# Patient Record
Sex: Male | Born: 1955 | Race: White | Hispanic: No | Marital: Married | Smoking: Never smoker
Health system: Southern US, Community
[De-identification: ages and names within clinical notes are randomized; demographics above are authoritative.]

## PROBLEM LIST (undated history)

## (undated) DIAGNOSIS — K4090 Unilateral inguinal hernia, without obstruction or gangrene, not specified as recurrent: Secondary | ICD-10-CM

## (undated) DIAGNOSIS — E7800 Pure hypercholesterolemia, unspecified: Secondary | ICD-10-CM

## (undated) DIAGNOSIS — K2270 Barrett's esophagus without dysplasia: Secondary | ICD-10-CM

## (undated) DIAGNOSIS — I493 Ventricular premature depolarization: Secondary | ICD-10-CM

## (undated) DIAGNOSIS — Z9289 Personal history of other medical treatment: Secondary | ICD-10-CM

## (undated) DIAGNOSIS — K219 Gastro-esophageal reflux disease without esophagitis: Secondary | ICD-10-CM

## (undated) DIAGNOSIS — N289 Disorder of kidney and ureter, unspecified: Secondary | ICD-10-CM

## (undated) DIAGNOSIS — M67919 Unspecified disorder of synovium and tendon, unspecified shoulder: Secondary | ICD-10-CM

## (undated) DIAGNOSIS — J3089 Other allergic rhinitis: Secondary | ICD-10-CM

## (undated) DIAGNOSIS — N183 Chronic kidney disease, stage 3 (moderate): Secondary | ICD-10-CM

## (undated) DIAGNOSIS — T7840XA Allergy, unspecified, initial encounter: Secondary | ICD-10-CM

## (undated) HISTORY — DX: Allergy, unspecified, initial encounter: T78.40XA

## (undated) HISTORY — DX: Other allergic rhinitis: J30.89

## (undated) HISTORY — PX: WISDOM TOOTH EXTRACTION: SHX21

## (undated) HISTORY — DX: Barrett's esophagus without dysplasia: K22.70

## (undated) HISTORY — DX: Unspecified disorder of synovium and tendon, unspecified shoulder: M67.919

## (undated) HISTORY — DX: Disorder of kidney and ureter, unspecified: N28.9

## (undated) HISTORY — DX: Ventricular premature depolarization: I49.3

## (undated) HISTORY — PX: EYE SURGERY: SHX253

## (undated) HISTORY — DX: Pure hypercholesterolemia, unspecified: E78.00

## (undated) HISTORY — DX: Chronic kidney disease, stage 3 (moderate): N18.3

## (undated) HISTORY — DX: Personal history of other medical treatment: Z92.89

## (undated) HISTORY — DX: Unilateral inguinal hernia, without obstruction or gangrene, not specified as recurrent: K40.90

## (undated) HISTORY — PX: CIRCUMCISION: SUR203

## (undated) HISTORY — PX: OTHER SURGICAL HISTORY: SHX169

## (undated) HISTORY — DX: Gastro-esophageal reflux disease without esophagitis: K21.9

---

## 2007-01-26 ENCOUNTER — Ambulatory Visit: Payer: Self-pay | Admitting: Gastroenterology

## 2007-05-20 ENCOUNTER — Ambulatory Visit: Payer: Self-pay | Admitting: Internal Medicine

## 2008-06-02 ENCOUNTER — Ambulatory Visit: Payer: Self-pay | Admitting: Family Medicine

## 2011-06-13 ENCOUNTER — Ambulatory Visit: Payer: Self-pay | Admitting: Gastroenterology

## 2011-06-13 HISTORY — PX: COLONOSCOPY: SHX5424

## 2011-06-13 LAB — COLOGUARD: Cologuard: NEGATIVE

## 2011-06-13 LAB — HM COLONOSCOPY

## 2011-06-17 LAB — PATHOLOGY REPORT

## 2011-10-24 ENCOUNTER — Ambulatory Visit: Payer: Self-pay | Admitting: Gastroenterology

## 2012-06-11 ENCOUNTER — Ambulatory Visit: Payer: Self-pay | Admitting: Gastroenterology

## 2012-06-14 LAB — PATHOLOGY REPORT

## 2013-01-25 ENCOUNTER — Ambulatory Visit: Payer: Self-pay | Admitting: Family Medicine

## 2014-07-04 DIAGNOSIS — N183 Chronic kidney disease, stage 3 unspecified: Secondary | ICD-10-CM

## 2014-07-04 HISTORY — DX: Chronic kidney disease, stage 3 unspecified: N18.30

## 2014-07-18 DIAGNOSIS — K2270 Barrett's esophagus without dysplasia: Secondary | ICD-10-CM | POA: Insufficient documentation

## 2014-07-20 DIAGNOSIS — E7800 Pure hypercholesterolemia, unspecified: Secondary | ICD-10-CM | POA: Insufficient documentation

## 2014-07-20 DIAGNOSIS — J309 Allergic rhinitis, unspecified: Secondary | ICD-10-CM | POA: Insufficient documentation

## 2014-07-21 DIAGNOSIS — N183 Chronic kidney disease, stage 3 unspecified: Secondary | ICD-10-CM | POA: Insufficient documentation

## 2014-10-13 IMAGING — US US EXTREM LOW VENOUS*R*
1 series · 14 of 24 positions shown · non-contrast
Comparison: none

REASON FOR EXAM: Call report [id number]  eval for DVT increased pain
heat redness  swelling
COMMENTS:

[Series 1: us extrem low venous*right* · 0.09mm/px · 14 of 43 slices shown]
[im 1/43]
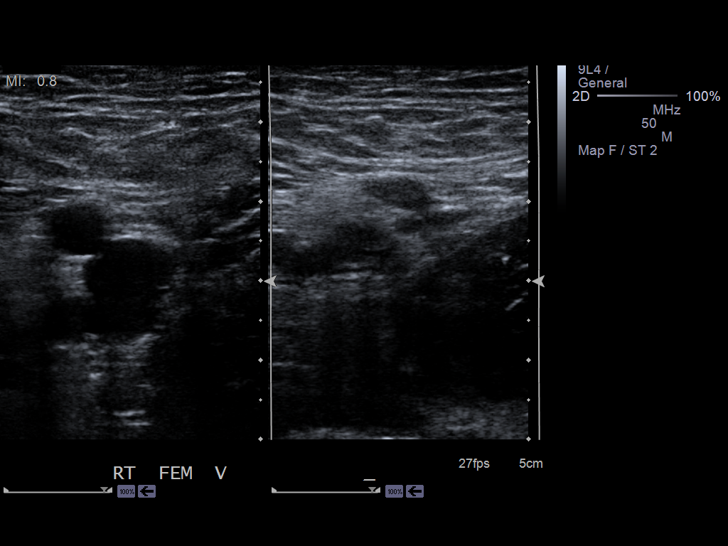
[im 4/43]
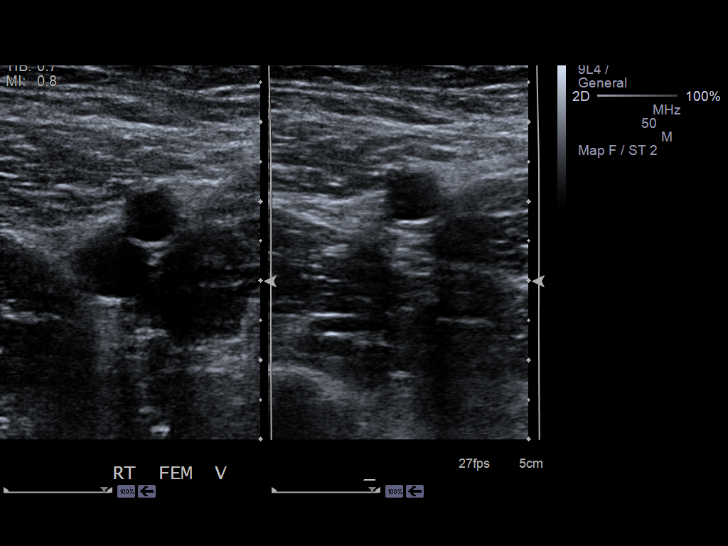
[im 8/43]
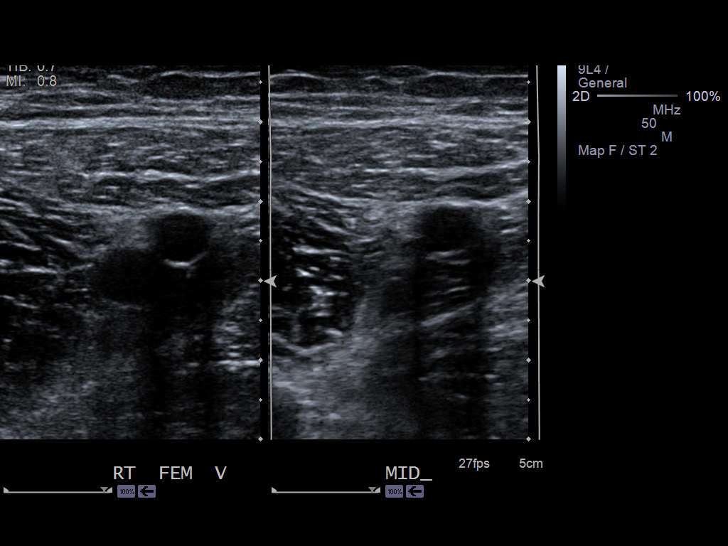
[im 11/43]
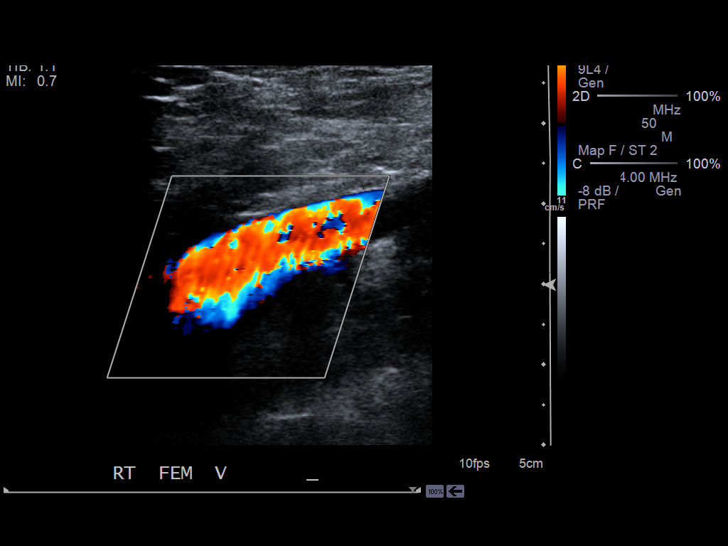
[im 13/43]
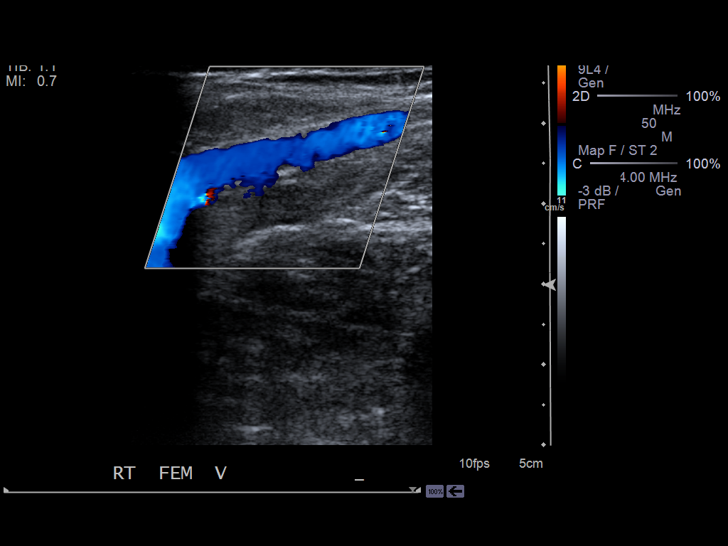
[im 17/43]
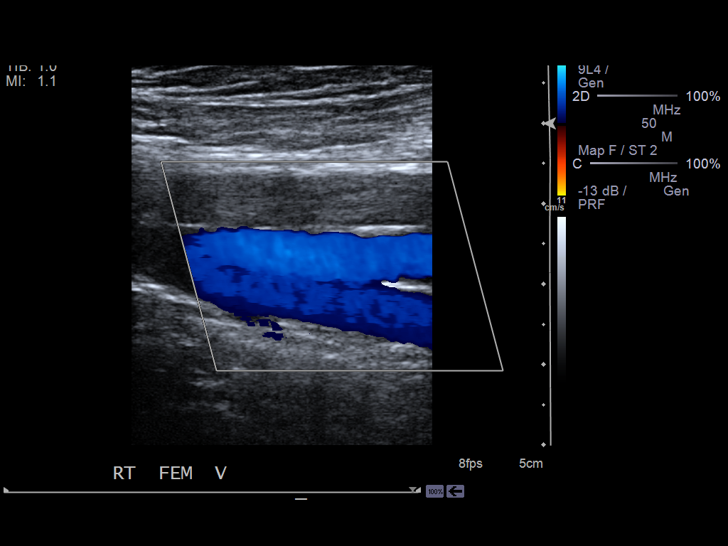
[im 21/43]
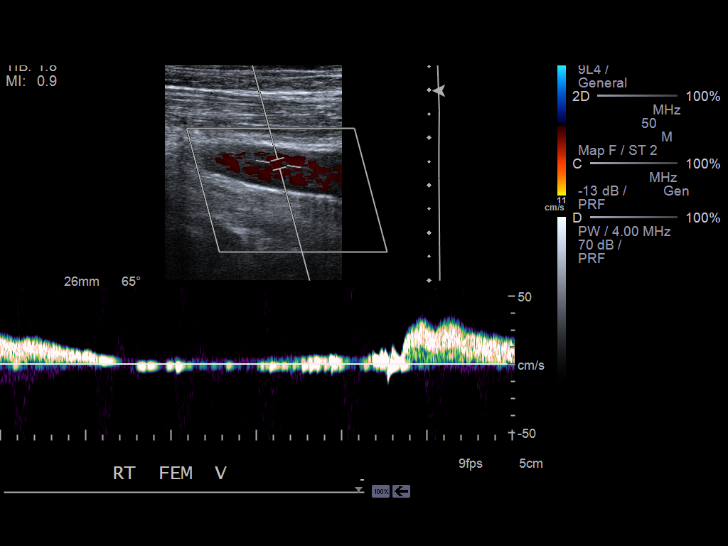
[im 22/43]
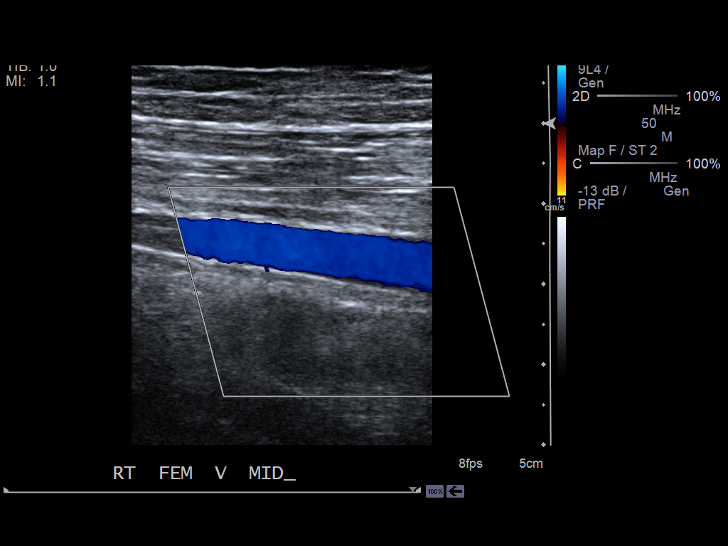
[im 26/43]
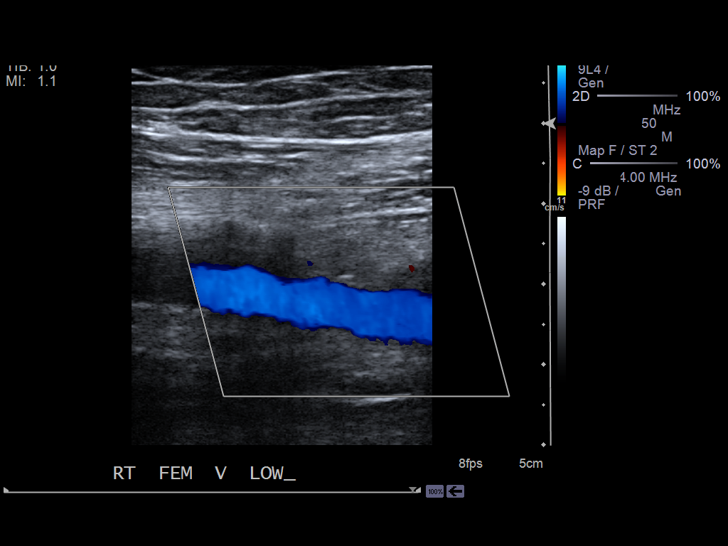
[im 30/43]
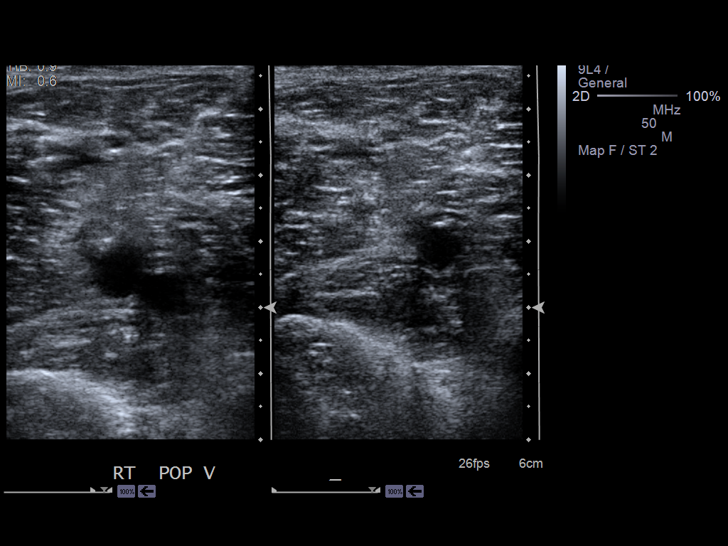
[im 33/43]
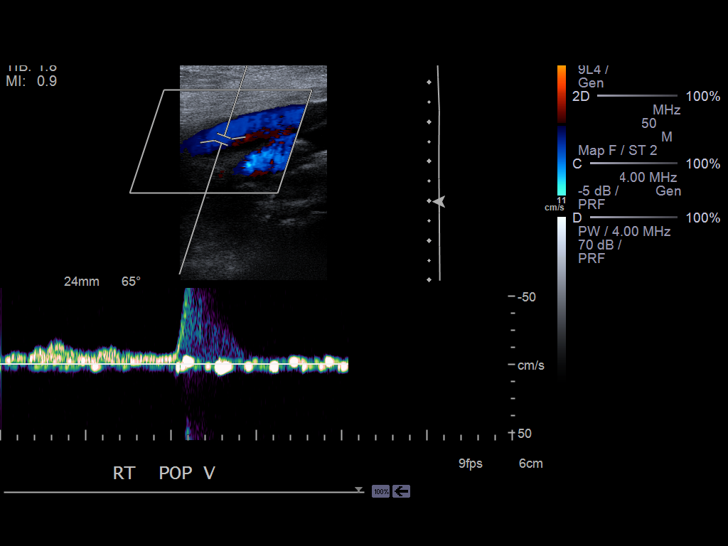
[im 35/43]
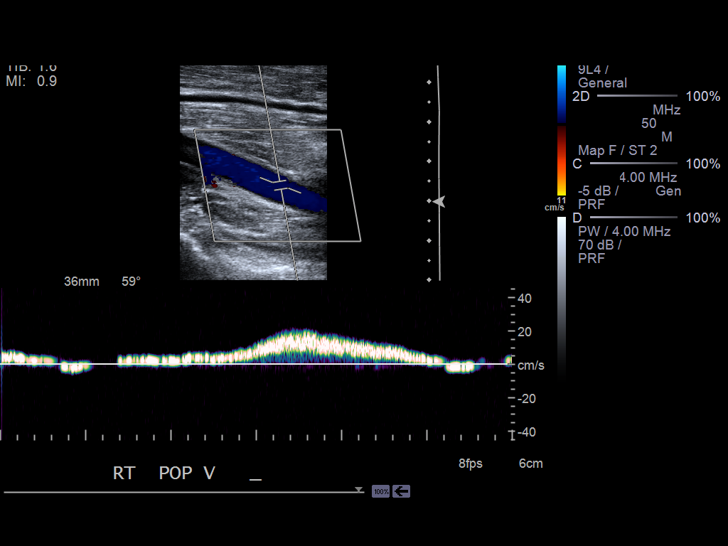
[im 39/43]
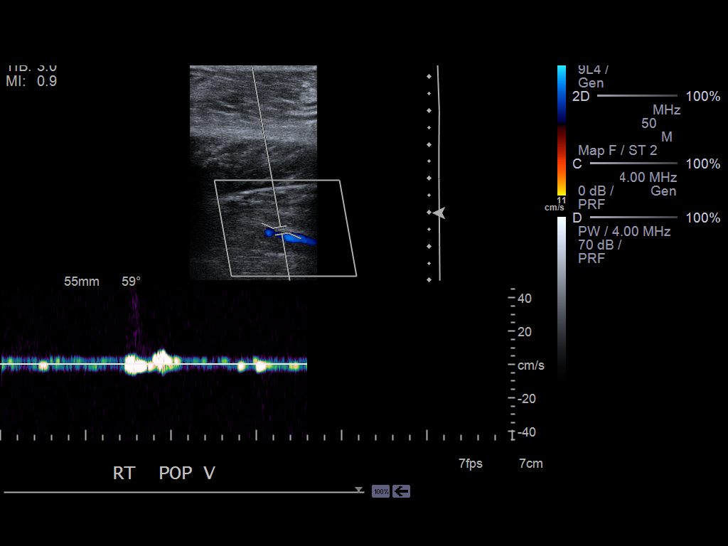
[im 43/43]
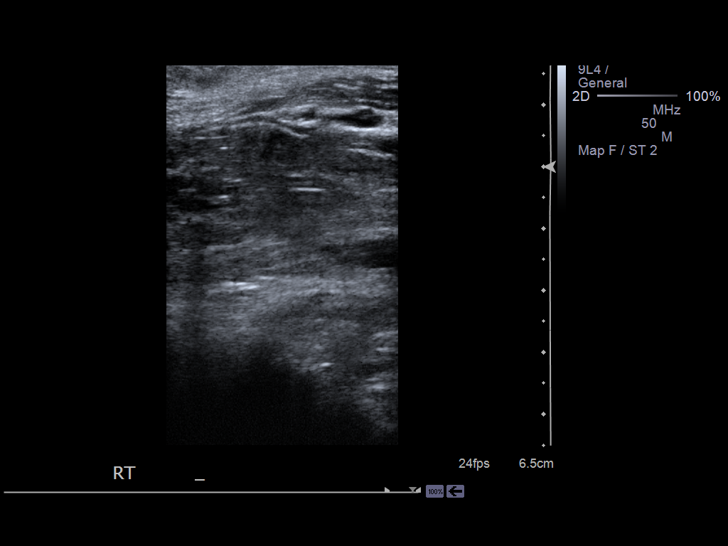

[14 of 24 positions shown; findings below may reference images not displayed]

PROCEDURE:     ALVILDE - ALVILDE DOPPLER VEINS RT LEG EXTR  - January 25, 2013  [DATE]

RESULT:     Comparison: None

Technique and findings: Multiple longitudinal and transverse grayscale as
well as color and spectral Doppler images of the right lower extremity veins
were obtained from the common femoral veins through the popliteal veins.

The right common femoral, femoral, and popliteal veins are patent,
demonstrating normal color-flow and compressibility. No intraluminal
thrombus is identified.  There is normal respiratory variation and
augmentation demonstrated at all vein levels.
IMPRESSION: No evidence of DVT in the right lower extremity.

[REDACTED]

## 2016-04-04 DIAGNOSIS — M7541 Impingement syndrome of right shoulder: Secondary | ICD-10-CM | POA: Insufficient documentation

## 2017-03-25 LAB — HM HIV SCREENING LAB: HM HIV Screening: NEGATIVE

## 2017-03-25 LAB — HM HEPATITIS C SCREENING LAB: HM Hepatitis Screen: NEGATIVE

## 2017-03-25 LAB — PSA: PSA: NEGATIVE

## 2017-03-27 DIAGNOSIS — R809 Proteinuria, unspecified: Secondary | ICD-10-CM | POA: Insufficient documentation

## 2017-04-15 HISTORY — PX: UPPER GASTROINTESTINAL ENDOSCOPY: SHX188

## 2017-09-28 DIAGNOSIS — K4090 Unilateral inguinal hernia, without obstruction or gangrene, not specified as recurrent: Secondary | ICD-10-CM

## 2017-09-28 HISTORY — DX: Unilateral inguinal hernia, without obstruction or gangrene, not specified as recurrent: K40.90

## 2017-12-04 DIAGNOSIS — I493 Ventricular premature depolarization: Secondary | ICD-10-CM

## 2017-12-04 DIAGNOSIS — Z9289 Personal history of other medical treatment: Secondary | ICD-10-CM

## 2017-12-04 HISTORY — DX: Personal history of other medical treatment: Z92.89

## 2017-12-04 HISTORY — DX: Ventricular premature depolarization: I49.3

## 2018-08-04 ENCOUNTER — Encounter: Payer: Self-pay | Admitting: Internal Medicine

## 2018-08-16 ENCOUNTER — Encounter: Payer: Self-pay | Admitting: Internal Medicine

## 2018-08-18 ENCOUNTER — Ambulatory Visit (INDEPENDENT_AMBULATORY_CARE_PROVIDER_SITE_OTHER): Payer: 59 | Admitting: Internal Medicine

## 2018-08-18 ENCOUNTER — Encounter: Payer: Self-pay | Admitting: Internal Medicine

## 2018-08-18 VITALS — BP 136/82 | HR 65 | Ht 70.0 in | Wt 179.0 lb

## 2018-08-18 DIAGNOSIS — E7800 Pure hypercholesterolemia, unspecified: Secondary | ICD-10-CM | POA: Diagnosis not present

## 2018-08-18 DIAGNOSIS — R7303 Prediabetes: Secondary | ICD-10-CM

## 2018-08-18 DIAGNOSIS — N183 Chronic kidney disease, stage 3 unspecified: Secondary | ICD-10-CM

## 2018-08-18 DIAGNOSIS — K2270 Barrett's esophagus without dysplasia: Secondary | ICD-10-CM | POA: Diagnosis not present

## 2018-08-18 DIAGNOSIS — I493 Ventricular premature depolarization: Secondary | ICD-10-CM | POA: Insufficient documentation

## 2018-08-18 NOTE — Progress Notes (Signed)
Date:  08/18/2018   Name:  Darryl Walker   DOB:  [birth year]   MRN:  [id number]   Chief Complaint: Establish Care  Hyperlipidemia  This is a chronic problem. Pertinent negatives include no chest pain or shortness of breath. Current antihyperlipidemic treatment includes statins. The current treatment provides significant improvement of lipids. There are no known risk factors for coronary artery disease.  Gastroesophageal Reflux  He reports no abdominal pain, no chest pain, no coughing, no dysphagia, no heartburn or no wheezing. Risk factors include Barrett's esophagus. He has tried a PPI for the symptoms. Past procedures include an EGD.   CKD - followed by [facility] for the past few years.  GFR stable ~ 56.  Palpitations -seen by Dr. Gwen Pounds and had a stress test that was normal. He was reassured.  He does not feel the palpitations.  He has never had issues with high BP.  Pre-diabetes - he had 6.2 in June,  He started cutting out the sugar in his coffee and does not drink tea.   He has not made any other diet changes.  Review of Systems  Constitutional: Negative for diaphoresis, fever and unexpected weight change.  HENT: Negative for trouble swallowing.   Eyes: Negative for visual disturbance.  Respiratory: Negative for cough, chest tightness, shortness of breath and wheezing.   Cardiovascular: Negative for chest pain, palpitations and leg swelling.  Gastrointestinal: Negative for abdominal pain, blood in stool, dysphagia and heartburn.  Musculoskeletal: Negative for arthralgias, back pain and gait problem.  Neurological: Negative for dizziness and headaches.  Psychiatric/Behavioral: Negative for dysphoric mood and sleep disturbance.    Patient Active Problem List   Diagnosis Date Noted  . Pre-diabetes 08/18/2018  . 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 Urine test positive for microalbuminuria 03/27/2017  . Impingement syndrome of right shoulder 04/04/2016  . CKD (chronic kidney disease)  stage 3, GFR 30-59 ml/min (HCC) 07/21/2014  . Allergic rhinitis 07/20/2014  . Hypercholesterolemia 07/20/2014  . Barrett's esophagus without dysplasia 07/18/2014    No Known Allergies  Past Surgical History:  Procedure Laterality Date  . CIRCUMCISION    . COLONOSCOPY  06/13/2011  . EYE SURGERY    . left arm fracture    . UPPER GASTROINTESTINAL ENDOSCOPY  04/15/2017   Barretts - repeat 2 yrs  . WISDOM TOOTH EXTRACTION      Social History   Tobacco Use  . Smoking status: Never Smoker  . Smokeless tobacco: Former Neurosurgeon    Types: Chew  Substance Use Topics  . Alcohol use: Yes    Alcohol/week: 2.0 standard drinks    Types: 2 Standard drinks or equivalent per week  . Drug use: Never     Medication list has been reviewed and updated.  Current Meds  Medication Sig  . atorvastatin (LIPITOR) 10 MG tablet Take by mouth.  . fluticasone (FLONASE) 50 MCG/ACT nasal spray Place into the nose.  . loratadine (CLARITIN) 10 MG tablet Take by mouth.  . Multiple Vitamin (MULTIVITAMIN) tablet Take by mouth.  . OMEPRAZOLE PO Take 40 mg by mouth. Take 2 otc 20 mg daily    PHQ 2/9 Scores 08/18/2018  PHQ - 2 Score 0    Physical Exam  Constitutional: He is oriented to person, place, and time. He appears well-developed. No distress.  HENT:  Head: Normocephalic and atraumatic.  Neck: Normal range of motion. Neck supple. Carotid bruit is not present.  Cardiovascular: Normal rate, regular rhythm, normal heart sounds, intact distal pulses  and normal pulses. Frequent extrasystoles are present.  Pulmonary/Chest: Effort normal and breath sounds normal. No respiratory distress.  Musculoskeletal: Normal range of motion.  Lymphadenopathy:    He has no cervical adenopathy.  Neurological: He is alert and oriented to person, place, and time.  Skin: Skin is warm and dry. No rash noted.  Psychiatric: He has a normal mood and affect. His behavior is normal. Thought content normal.  Nursing note and  vitals reviewed.   BP (!) 148/78 (BP Location: Right Arm, Patient Position: Sitting, Cuff Size: Normal)   Pulse 65   Ht 5\' 10"  (1.778 m)   Wt 179 lb (81.2 kg)   SpO2 97%   BMI 25.68 kg/m   Assessment and Plan: 1. Barrett's esophagus without dysplasia Continue omeprazole 20 mg bid  2. CKD (chronic kidney disease) stage 3, GFR 30-59 ml/min (HCC) Followed by Nephrology  3. Hypercholesterolemia On statin therapy  4. Pre-diabetes Discussed additional diet changes to be made - Hemoglobin A1c  5. Frequent PVCs asx   Partially dictated using Animal nutritionist. Any errors are unintentional.  Bari Edward, MD [facility]  08/18/2018

## 2018-08-19 LAB — HEMOGLOBIN A1C
Est. average glucose Bld gHb Est-mCnc: 123 mg/dL
Hgb A1c MFr Bld: 5.9 % — ABNORMAL HIGH (ref 4.8–5.6)

## 2018-08-27 LAB — BASIC METABOLIC PANEL
BUN: 18 (ref 4–21)
Creatinine: 1.4 — AB (ref 0.6–1.3)

## 2018-08-27 LAB — CBC AND DIFFERENTIAL
Hemoglobin: 14.6 (ref 13.5–17.5)
Platelets: 280 (ref 150–399)
WBC: 4.9

## 2018-09-07 ENCOUNTER — Encounter: Payer: Self-pay | Admitting: Internal Medicine

## 2018-09-20 ENCOUNTER — Ambulatory Visit
Admission: EM | Admit: 2018-09-20 | Discharge: 2018-09-20 | Disposition: A | Discharge status: Home / Self Care | Payer: 59 | Attending: Family Medicine | Admitting: Family Medicine

## 2018-09-20 ENCOUNTER — Other Ambulatory Visit: Payer: Self-pay

## 2018-09-20 DIAGNOSIS — T23262A Burn of second degree of back of left hand, initial encounter: Secondary | ICD-10-CM

## 2018-09-20 DIAGNOSIS — T23062A Burn of unspecified degree of back of left hand, initial encounter: Secondary | ICD-10-CM

## 2018-09-20 DIAGNOSIS — X088XXA Exposure to other specified smoke, fire and flames, initial encounter: Secondary | ICD-10-CM | POA: Diagnosis not present

## 2018-09-20 MED ORDER — DOXYCYCLINE HYCLATE 100 MG PO TABS: 100.0000 mg | ORAL_TABLET | Freq: Two times a day (BID) | ORAL | 0 refills | Status: DC
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
## 2018-09-20 NOTE — Discharge Instructions (Addendum)
Silvadene twice a day  Follow up with Primary Care provider later this week

## 2018-09-24 ENCOUNTER — Ambulatory Visit (INDEPENDENT_AMBULATORY_CARE_PROVIDER_SITE_OTHER): Payer: 59 | Admitting: Internal Medicine

## 2018-09-24 ENCOUNTER — Encounter: Payer: Self-pay | Admitting: Internal Medicine

## 2018-09-24 VITALS — BP 142/70 | HR 79 | Ht 70.0 in | Wt 179.0 lb

## 2018-09-24 DIAGNOSIS — N183 Chronic kidney disease, stage 3 unspecified: Secondary | ICD-10-CM

## 2018-09-24 DIAGNOSIS — T23162A Burn of first degree of back of left hand, initial encounter: Secondary | ICD-10-CM | POA: Diagnosis not present

## 2018-09-24 DIAGNOSIS — Z23 Encounter for immunization: Secondary | ICD-10-CM

## 2018-09-24 MED ORDER — SILVER SULFADIAZINE 1 % EX CREA: 1.0000 "application " | TOPICAL_CREAM | Freq: Every day | CUTANEOUS | 0 refills | Status: DC

## 2018-09-24 NOTE — Patient Instructions (Addendum)
Check blood pressure weekly.  If consistently higher than 140 systolic, return for discussion of medication.  Bring the reading to your next appointment.
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
## 2018-12-22 ENCOUNTER — Other Ambulatory Visit: Payer: Self-pay

## 2018-12-22 ENCOUNTER — Ambulatory Visit (INDEPENDENT_AMBULATORY_CARE_PROVIDER_SITE_OTHER): Payer: 59 | Admitting: Internal Medicine

## 2018-12-22 ENCOUNTER — Encounter: Payer: Self-pay | Admitting: Internal Medicine

## 2018-12-22 VITALS — BP 138/86 | HR 82 | Temp 98.3°F | Ht 70.0 in | Wt 173.0 lb

## 2018-12-22 DIAGNOSIS — R42 Dizziness and giddiness: Secondary | ICD-10-CM

## 2018-12-22 DIAGNOSIS — J0100 Acute maxillary sinusitis, unspecified: Secondary | ICD-10-CM

## 2018-12-22 DIAGNOSIS — H66001 Acute suppurative otitis media without spontaneous rupture of ear drum, right ear: Secondary | ICD-10-CM

## 2018-12-22 MED ORDER — PREDNISONE 10 MG PO TABS: 10.0000 mg | ORAL_TABLET | ORAL | 0 refills | Status: AC

## 2018-12-22 MED ORDER — AMOXICILLIN-POT CLAVULANATE 875-125 MG PO TABS: 1.0000 | ORAL_TABLET | Freq: Two times a day (BID) | ORAL | 0 refills | Status: AC

## 2018-12-22 NOTE — Progress Notes (Signed)
Date:  12/22/2018   Name:  Darryl Walker   DOB:  1956/01/13   MRN:  [id number]   Chief Complaint: Cough (Cough with dizziness. Cough started last week with pressure in right ear. ) and Dizziness (Leaning down and laying right side he feels dizzy. Has had vertigo in past. )  Cough  This is a new problem. The current episode started 1 to 4 weeks ago. The problem occurs every few minutes. The cough is productive of sputum. Associated symptoms include ear pain (only after a hard cough) and a sore throat. Pertinent negatives include no chest pain, chills, fever, headaches, shortness of breath or wheezing.  Dizziness  This is a new problem. The current episode started in the past 7 days. The problem has been gradually improving. Associated symptoms include congestion, coughing, nausea and a sore throat. Pertinent negatives include no chest pain, chills, fatigue, fever or headaches.    Review of Systems  Constitutional: Negative for chills, fatigue and fever.  HENT: Positive for congestion, ear pain (only after a hard cough), sinus pressure, sinus pain and sore throat. Negative for trouble swallowing.   Eyes: Negative for visual disturbance.  Respiratory: Positive for cough. Negative for chest tightness, shortness of breath and wheezing.   Cardiovascular: Negative for chest pain, palpitations and leg swelling.  Gastrointestinal: Positive for nausea.  Neurological: Positive for dizziness. Negative for light-headedness and headaches.    Patient Active Problem List   Diagnosis Date Noted  . Pre-diabetes 08/18/2018  . 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 Urine test positive for microalbuminuria 03/27/2017  . Impingement syndrome of right shoulder 04/04/2016  . CKD (chronic kidney disease) stage 3, GFR 30-59 ml/min (HCC) 07/21/2014  . Allergic rhinitis 07/20/2014  . Hypercholesterolemia 07/20/2014  . Barrett's esophagus without dysplasia 07/18/2014    No Known Allergies  Past Surgical History:    Procedure Laterality Date  . CIRCUMCISION    . COLONOSCOPY  06/13/2011  . EYE SURGERY    . left arm fracture    . UPPER GASTROINTESTINAL ENDOSCOPY  04/15/2017   Barretts - repeat 2 yrs  . WISDOM TOOTH EXTRACTION      Social History   Tobacco Use  . Smoking status: Never Smoker  . Smokeless tobacco: Former Neurosurgeon    Types: Chew  Substance Use Topics  . Alcohol use: Yes    Alcohol/week: 2.0 standard drinks    Types: 2 Standard drinks or equivalent per week  . Drug use: Never     Medication list has been reviewed and updated.  Current Meds  Medication Sig  . atorvastatin (LIPITOR) 10 MG tablet Take by mouth.  . fluticasone (FLONASE) 50 MCG/ACT nasal spray Place into the nose.  . loratadine (CLARITIN) 10 MG tablet Take by mouth.  . Multiple Vitamin (MULTIVITAMIN) tablet Take by mouth.  . OMEPRAZOLE PO Take 40 mg by mouth. Take 2 otc 20 mg daily  . silver sulfADIAZINE (SILVADENE) 1 % cream Apply 1 application topically daily.    PHQ 2/9 Scores 12/22/2018 08/18/2018  PHQ - 2 Score 0 0    Physical Exam Constitutional:      Appearance: He is well-developed.  HENT:     Right Ear: Ear canal and external ear normal. A middle ear effusion is present. Tympanic membrane is not erythematous or retracted.     Left Ear: Ear canal and external ear normal. Tympanic membrane is not erythematous or retracted.     Nose:     Right Sinus: Maxillary  sinus tenderness and frontal sinus tenderness present.     Left Sinus: Maxillary sinus tenderness and frontal sinus tenderness present.     Mouth/Throat:     Mouth: No oral lesions.     Pharynx: Uvula midline. Posterior oropharyngeal erythema present. No oropharyngeal exudate.  Eyes:     Extraocular Movements: Extraocular movements intact.  Cardiovascular:     Rate and Rhythm: Normal rate and regular rhythm.     Heart sounds: Normal heart sounds.  Pulmonary:     Breath sounds: Normal breath sounds. No wheezing or rales.  Lymphadenopathy:      Cervical: No cervical adenopathy.  Neurological:     Mental Status: He is alert and oriented to person, place, and time.     BP 138/86   Pulse 82   Temp 98.3 F (36.8 C) (Oral)   Ht 5\' 10"  (1.778 m)   Wt 173 lb (78.5 kg)   SpO2 97%   BMI 24.82 kg/m   Assessment and Plan: 1. Non-recurrent acute suppurative otitis media of right ear without spontaneous rupture of tympanic membrane - amoxicillin-clavulanate (AUGMENTIN) 875-125 MG tablet; Take 1 tablet by mouth 2 (two) times daily for 10 days.  Dispense: 20 tablet; Refill: 0  2. Vertigo Due to inner ear infection/effusion Can use over the counter Dramamine if needed - predniSONE (DELTASONE) 10 MG tablet; Take 1 tablet (10 mg total) by mouth as directed for 6 days. Take 6,5,4,3,2,1 then stop  Dispense: 21 tablet; Refill: 0  3. Acute non-recurrent maxillary sinusitis - amoxicillin-clavulanate (AUGMENTIN) 875-125 MG tablet; Take 1 tablet by mouth 2 (two) times daily for 10 days.  Dispense: 20 tablet; Refill: 0   Partially dictated using Animal nutritionist. Any errors are unintentional.  Bari Edward, MD [facility]  12/22/2018

## 2019-01-26 ENCOUNTER — Encounter: Payer: 59 | Admitting: Internal Medicine

## 2019-03-21 ENCOUNTER — Ambulatory Visit: Payer: 59

## 2019-03-23 ENCOUNTER — Ambulatory Visit: Payer: 59

## 2019-03-25 ENCOUNTER — Other Ambulatory Visit: Payer: Self-pay

## 2019-03-25 ENCOUNTER — Ambulatory Visit (INDEPENDENT_AMBULATORY_CARE_PROVIDER_SITE_OTHER): Payer: 59

## 2019-03-25 DIAGNOSIS — Z23 Encounter for immunization: Secondary | ICD-10-CM | POA: Diagnosis not present

## 2019-04-11 ENCOUNTER — Other Ambulatory Visit: Payer: Self-pay | Admitting: Internal Medicine

## 2019-04-11 ENCOUNTER — Other Ambulatory Visit: Payer: Self-pay

## 2019-04-11 ENCOUNTER — Encounter: Payer: Self-pay | Admitting: Internal Medicine

## 2019-04-11 MED ORDER — ATORVASTATIN CALCIUM 10 MG PO TABS: 10.0000 mg | ORAL_TABLET | Freq: Every day | ORAL | 0 refills | Status: DC

## 2019-04-26 ENCOUNTER — Other Ambulatory Visit: Payer: Self-pay

## 2019-04-26 ENCOUNTER — Ambulatory Visit (INDEPENDENT_AMBULATORY_CARE_PROVIDER_SITE_OTHER): Payer: 59 | Admitting: Internal Medicine

## 2019-04-26 ENCOUNTER — Encounter: Payer: Self-pay | Admitting: Internal Medicine

## 2019-04-26 VITALS — BP 132/88 | HR 63 | Ht 70.0 in | Wt 174.0 lb

## 2019-04-26 DIAGNOSIS — I493 Ventricular premature depolarization: Secondary | ICD-10-CM | POA: Diagnosis not present

## 2019-04-26 DIAGNOSIS — E7800 Pure hypercholesterolemia, unspecified: Secondary | ICD-10-CM

## 2019-04-26 DIAGNOSIS — Z125 Encounter for screening for malignant neoplasm of prostate: Secondary | ICD-10-CM | POA: Diagnosis not present

## 2019-04-26 DIAGNOSIS — K2270 Barrett's esophagus without dysplasia: Secondary | ICD-10-CM | POA: Diagnosis not present

## 2019-04-26 DIAGNOSIS — R7303 Prediabetes: Secondary | ICD-10-CM

## 2019-04-26 DIAGNOSIS — Z0000 Encounter for general adult medical examination without abnormal findings: Secondary | ICD-10-CM | POA: Diagnosis not present

## 2019-04-26 LAB — POCT URINALYSIS DIPSTICK
Bilirubin, UA: NEGATIVE
Blood, UA: NEGATIVE
Glucose, UA: NEGATIVE
Ketones, UA: NEGATIVE
Leukocytes, UA: NEGATIVE
Nitrite, UA: NEGATIVE
Protein, UA: POSITIVE — AB
Spec Grav, UA: 1.02 (ref 1.010–1.025)
Urobilinogen, UA: 0.2 E.U./dL
pH, UA: 6 (ref 5.0–8.0)

## 2019-04-26 MED ORDER — ATORVASTATIN CALCIUM 10 MG PO TABS: 10.0000 mg | ORAL_TABLET | Freq: Every day | ORAL | 3 refills | Status: AC

## 2019-04-26 NOTE — Progress Notes (Signed)
Date:  04/26/2019   Name:  Darryl SimmersLarry A Tremain   DOB:  January 04, 1956   MRN:  [id number]   Chief Complaint: Annual Exam Darryl Walker is a 63 y.o. male who presents today for his Complete Annual Exam. He feels well. He reports exercising daily with yard work, walking. He reports he is sleeping well.   Colonoscopy 2012  Gastroesophageal Reflux He reports no abdominal pain, no chest pain, no choking, no sore throat or no wheezing. This is a chronic problem. Pertinent negatives include no fatigue. Risk factors include Barrett's esophagus (followed by GI). He has tried a PPI for the symptoms. Past procedures include an EGD.  Diabetes He presents for his follow-up diabetic visit. Diabetes type: prediabetes. His disease course has been stable. Pertinent negatives for hypoglycemia include no dizziness or headaches. Pertinent negatives for diabetes include no chest pain, no fatigue and no weakness. When asked about current treatments, none (he has cut out sweet drinks mostly but otherwise eating a balanced diet) were reported. He is compliant with treatment most of the time.  Hyperlipidemia This is a chronic problem. The problem is controlled. Pertinent negatives include no chest pain, myalgias or shortness of breath. Current antihyperlipidemic treatment includes statins. The current treatment provides significant improvement of lipids.  Bradycardia/PVCs - recently seen by Cardiology.  No intervention recommended, just lipid lowering and life style changes.  Lab Results  Component Value Date   HGBA1C 5.9 (H) 08/18/2018   No results found for: CHOL, HDL, LDLCALC, LDLDIRECT, TRIG, CHOLHDL  Lab Results  Component Value Date   CREATININE 1.4 (A) 08/27/2018   BUN 18 08/27/2018     Review of Systems  Constitutional: Negative for appetite change, chills, diaphoresis, fatigue and unexpected weight change.  HENT: Positive for congestion. Negative for hearing loss, sore throat, tinnitus, trouble swallowing  and voice change.   Eyes: Negative for visual disturbance.  Respiratory: Negative for choking, chest tightness, shortness of breath and wheezing.   Cardiovascular: Negative for chest pain, palpitations and leg swelling.  Gastrointestinal: Negative for abdominal pain, blood in stool, constipation and diarrhea.  Genitourinary: Negative for difficulty urinating, dysuria, frequency and urgency.  Musculoskeletal: Positive for arthralgias (knee OA). Negative for back pain and myalgias.  Skin: Negative for color change and rash.  Allergic/Immunologic: Positive for environmental allergies.  Neurological: Negative for dizziness, syncope, weakness and headaches.  Hematological: Negative for adenopathy.  Psychiatric/Behavioral: Negative for dysphoric mood and sleep disturbance.    Patient Active Problem List   Diagnosis Date Noted  . Pre-diabetes 08/18/2018  . 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 Urine test positive for microalbuminuria 03/27/2017  . Impingement syndrome of right shoulder 04/04/2016  . CKD (chronic kidney disease) stage 3, GFR 30-59 ml/min (HCC) 07/21/2014  . Allergic rhinitis 07/20/2014  . Hypercholesterolemia 07/20/2014  . Barrett's esophagus without dysplasia 07/18/2014    No Known Allergies  Past Surgical History:  Procedure Laterality Date  . CIRCUMCISION    . COLONOSCOPY  06/13/2011  . EYE SURGERY    . left arm fracture    . UPPER GASTROINTESTINAL ENDOSCOPY  04/15/2017   Barretts - repeat 2 yrs  . WISDOM TOOTH EXTRACTION      Social History   Tobacco Use  . Smoking status: Never Smoker  . Smokeless tobacco: Former NeurosurgeonUser    Types: Chew  Substance Use Topics  . Alcohol use: Yes    Alcohol/week: 2.0 standard drinks    Types: 2 Standard drinks or equivalent per week  .  Drug use: Never     Medication list has been reviewed and updated.  Current Meds  Medication Sig  . atorvastatin (LIPITOR) 10 MG tablet Take 1 tablet (10 mg total) by mouth daily at 6 PM.  .  famotidine (PEPCID) 20 MG tablet Take by mouth.  . fluticasone (FLONASE) 50 MCG/ACT nasal spray Place into the nose.  . loratadine (CLARITIN) 10 MG tablet Take by mouth.  . Multiple Vitamin (MULTIVITAMIN) tablet Take by mouth.  . OMEPRAZOLE PO Take 40 mg by mouth. Take 2 otc 20 mg daily    PHQ 2/9 Scores 04/26/2019 12/22/2018 08/18/2018  PHQ - 2 Score 0 0 0    BP Readings from Last 3 Encounters:  04/26/19 132/88  12/22/18 138/86  09/24/18 (!) 142/70    Physical Exam Vitals signs and nursing note reviewed.  Constitutional:      Appearance: Normal appearance. He is well-developed.  HENT:     Head: Normocephalic.     Right Ear: Tympanic membrane, ear canal and external ear normal.     Left Ear: Tympanic membrane, ear canal and external ear normal.     Nose: Nose normal.  Eyes:     Conjunctiva/sclera: Conjunctivae normal.     Pupils: Pupils are equal, round, and reactive to light.  Neck:     Musculoskeletal: Normal range of motion and neck supple.     Thyroid: No thyromegaly.     Vascular: No carotid bruit.  Cardiovascular:     Rate and Rhythm: Normal rate and regular rhythm.  No extrasystoles are present.    Heart sounds: Normal heart sounds.  Pulmonary:     Effort: Pulmonary effort is normal.     Breath sounds: Normal breath sounds. No wheezing.  Chest:     Breasts:        Right: No mass.        Left: No mass.  Abdominal:     General: Bowel sounds are normal.     Palpations: Abdomen is soft.     Tenderness: There is no abdominal tenderness.  Musculoskeletal: Normal range of motion.     Right lower leg: No edema.     Left lower leg: No edema.  Lymphadenopathy:     Cervical: No cervical adenopathy.  Skin:    General: Skin is warm and dry.     Capillary Refill: Capillary refill takes less than 2 seconds.  Neurological:     Mental Status: He is alert and oriented to person, place, and time.     Deep Tendon Reflexes: Reflexes are normal and symmetric.  Psychiatric:         Speech: Speech normal.        Behavior: Behavior normal.        Thought Content: Thought content normal.        Judgment: Judgment normal.     Wt Readings from Last 3 Encounters:  04/26/19 174 lb (78.9 kg)  12/22/18 173 lb (78.5 kg)  09/24/18 179 lb (81.2 kg)    BP 132/88   Pulse 63   Ht 5\' 10"  (1.778 m)   Wt 174 lb (78.9 kg)   SpO2 96%   BMI 24.97 kg/m   Assessment and Plan: 1. Annual physical exam Normal exam Continue healthy diet, exericse - POCT urinalysis dipstick  2. Prostate cancer screening DRE deferred - PSA  3. Barrett's esophagus without dysplasia Continue PPI, follow up with GI for interval EGDs - CBC with Differential/Platelet  4. Hypercholesterolemia On statin  therapy - Comprehensive metabolic panel - Lipid panel  5. Pre-diabetes Re-enforced low carb diet - Hemoglobin A1c  6. Frequent PVCs Stable, asx   Partially dictated using Animal nutritionistDragon software. Any errors are unintentional.  Bari EdwardLaura Talicia Sui, MD [facility]  04/26/2019

## 2019-04-26 NOTE — Patient Instructions (Signed)
Schedule Dermatology evaluation, routine eye exam and dental exam.

## 2019-04-27 LAB — COMPREHENSIVE METABOLIC PANEL
ALT: 25 IU/L (ref 0–44)
AST: 23 IU/L (ref 0–40)
Albumin/Globulin Ratio: 2.2 (ref 1.2–2.2)
Albumin: 4.6 g/dL (ref 3.8–4.8)
Alkaline Phosphatase: 67 IU/L (ref 39–117)
BUN/Creatinine Ratio: 12 (ref 10–24)
BUN: 15 mg/dL (ref 8–27)
Bilirubin Total: 0.4 mg/dL (ref 0.0–1.2)
CO2: 23 mmol/L (ref 20–29)
Calcium: 9.4 mg/dL (ref 8.6–10.2)
Chloride: 105 mmol/L (ref 96–106)
Creatinine, Ser: 1.26 mg/dL (ref 0.76–1.27)
GFR calc Af Amer: 70 mL/min/{1.73_m2} (ref >59)
GFR calc non Af Amer: 61 mL/min/{1.73_m2} (ref >59)
Globulin, Total: 2.1 g/dL (ref 1.5–4.5)
Glucose: 100 mg/dL — ABNORMAL HIGH (ref 65–99)
Potassium: 4.7 mmol/L (ref 3.5–5.2)
Sodium: 140 mmol/L (ref 134–144)
Total Protein: 6.7 g/dL (ref 6.0–8.5)

## 2019-04-27 LAB — CBC WITH DIFFERENTIAL/PLATELET
Basophils Absolute: 0.1 10*3/uL (ref 0.0–0.2)
Basos: 1 %
EOS (ABSOLUTE): 0.2 10*3/uL (ref 0.0–0.4)
Eos: 4 %
Hematocrit: 43.3 % (ref 37.5–51.0)
Hemoglobin: 14.8 g/dL (ref 13.0–17.7)
Immature Grans (Abs): 0 10*3/uL (ref 0.0–0.1)
Immature Granulocytes: 0 %
Lymphocytes Absolute: 1.8 10*3/uL (ref 0.7–3.1)
Lymphs: 36 %
MCH: 31.6 pg (ref 26.6–33.0)
MCHC: 34.2 g/dL (ref 31.5–35.7)
MCV: 92 fL (ref 79–97)
Monocytes Absolute: 0.4 10*3/uL (ref 0.1–0.9)
Monocytes: 8 %
Neutrophils Absolute: 2.5 10*3/uL (ref 1.4–7.0)
Neutrophils: 51 %
Platelets: 269 10*3/uL (ref 150–450)
RBC: 4.69 x10E6/uL (ref 4.14–5.80)
RDW: 12.9 % (ref 11.6–15.4)
WBC: 5 10*3/uL (ref 3.4–10.8)

## 2019-04-27 LAB — LIPID PANEL
Chol/HDL Ratio: 2.5 ratio (ref 0.0–5.0)
Cholesterol, Total: 145 mg/dL (ref 100–199)
HDL: 58 mg/dL (ref >39)
LDL Calculated: 76 mg/dL (ref 0–99)
Triglycerides: 53 mg/dL (ref 0–149)
VLDL Cholesterol Cal: 11 mg/dL (ref 5–40)

## 2019-04-27 LAB — HEMOGLOBIN A1C
Est. average glucose Bld gHb Est-mCnc: 126 mg/dL
Hgb A1c MFr Bld: 6 % — ABNORMAL HIGH (ref 4.8–5.6)

## 2019-04-27 LAB — PSA: Prostate Specific Ag, Serum: 1.9 ng/mL (ref 0.0–4.0)

## 2019-07-15 ENCOUNTER — Other Ambulatory Visit: Payer: Self-pay

## 2019-07-15 ENCOUNTER — Encounter: Payer: Self-pay | Admitting: Emergency Medicine

## 2019-07-15 ENCOUNTER — Ambulatory Visit
Admission: EM | Admit: 2019-07-15 | Discharge: 2019-07-15 | Disposition: A | Discharge status: Home / Self Care | Payer: 59 | Attending: Emergency Medicine | Admitting: Emergency Medicine

## 2019-07-15 DIAGNOSIS — S0121XA Laceration without foreign body of nose, initial encounter: Secondary | ICD-10-CM

## 2019-07-15 DIAGNOSIS — W228XXA Striking against or struck by other objects, initial encounter: Secondary | ICD-10-CM

## 2019-07-15 NOTE — Discharge Instructions (Addendum)
Keep this clean and dry for at least 5 days.  Should be fully healed within 10 days.  Return here for any signs of infection.

## 2019-07-15 NOTE — ED Provider Notes (Signed)
HPI  SUBJECTIVE:  Darryl Walker is a 63 y.o. male who presents with laceration to the bridge of his nose sustained 1 hour ago.  States that he was pulling a wrench towards him, it slipped, hitting him in the face.  He reports bleeding which resolved with pressure.  He reports localized pain, which has also resolved.  No epistaxis, rhinorrhea, difficulty breathing.  He does not think that his nose is broken.  There are no aggravating or alleviating factors.  He has not tried anything for this.  Past medical history of chronic kidney disease stage III, prediabetes.  No history of hypertension.  He is not on any anticoagulants or antiplatelets.  Tetanus is up-to-date.  PMD: Reubin MilanBerglund, Laura H, MD   Past Medical History:  Diagnosis Date  . Allergy   . Barrett esophagus   . Chronic kidney disease (CKD), stage III (moderate) (HCC) 07/2014  . Environmental and seasonal allergies   . GERD (gastroesophageal reflux disease)   . History of cardiac monitoring 12/2017   referred to Cardiology   . Hypercholesterolemia   . Inguinal hernia 09/28/2017  . Kidney disease   . Rotator cuff dysfunction   . Ventricular ectopy 12/2017    Past Surgical History:  Procedure Laterality Date  . CIRCUMCISION    . COLONOSCOPY  06/13/2011  . EYE SURGERY    . left arm fracture    . UPPER GASTROINTESTINAL ENDOSCOPY  04/15/2017   Barretts - repeat 2 yrs  . WISDOM TOOTH EXTRACTION      Family History  Problem Relation Age of Onset  . Diabetes Mother   . Glaucoma Father   . Prostate cancer Father   . Glaucoma Sister     Social History   Tobacco Use  . Smoking status: Never Smoker  . Smokeless tobacco: Former NeurosurgeonUser    Types: Chew  Substance Use Topics  . Alcohol use: Yes    Alcohol/week: 2.0 standard drinks    Types: 2 Standard drinks or equivalent per week  . Drug use: Never    No current facility-administered medications for this encounter.   Current Outpatient Medications:  .  atorvastatin  (LIPITOR) 10 MG tablet, Take 1 tablet (10 mg total) by mouth daily at 6 PM., Disp: 90 tablet, Rfl: 3 .  famotidine (PEPCID) 20 MG tablet, Take by mouth., Disp: , Rfl:  .  fluticasone (FLONASE) 50 MCG/ACT nasal spray, Place into the nose., Disp: , Rfl:  .  loratadine (CLARITIN) 10 MG tablet, Take by mouth., Disp: , Rfl:  .  Multiple Vitamin (MULTIVITAMIN) tablet, Take by mouth., Disp: , Rfl:  .  OMEPRAZOLE PO, Take 40 mg by mouth. Take 2 otc 20 mg daily, Disp: , Rfl:   No Known Allergies   ROS  As noted in HPI.   Physical Exam  BP 139/84 (BP Location: Left Arm)   Pulse 62   Temp 98.4 F (36.9 C) (Oral)   Resp 16   Ht 5\' 10"  (1.778 m)   Wt 77.1 kg   SpO2 98%   BMI 24.39 kg/m   Constitutional: Well developed, well nourished, no acute distress Eyes:  EOMI, conjunctiva normal bilaterally HENT: Normocephalic,mucus membranes moist 1 cm flap laceration on bridge of nose.  No foreign body or debris seen underneath the flap.  No bony tenderness over entire nose.  Septum midline.  No epistaxis.     Respiratory: Normal inspiratory effort Cardiovascular: Normal rate GI: nondistended skin: No rash, skin intact Musculoskeletal: no deformities  Neurologic: Alert & oriented x 3, no focal neuro deficits Psychiatric: Speech and behavior appropriate   ED Course   Medications - No data to display  No orders of the defined types were placed in this encounter.   No results found for this or any previous visit (from the past 24 hour(s)). No results found.  ED Clinical Impression  1. Laceration of nose, initial encounter      ED Assessment/Plan  No evidence of nasal fracture.  Patient with a laceration to the bridge of his nose. Procedure note: Had patient wash with chlorhexidine/tap water, then irrigated out with wound cleansing solution.  Dermabonded flap in place and put 3 Steri-Strips on top.  Patient tolerated procedure well  Tetanus is up-to-date.  Wound was clean, and  was irrigated extensively here.  Deferring antibiotics today.  Return here for any signs of infection.  Keep this clean and dry for at least 4 to 5 days.  Should be completely healed within 10 days.  Discussed  MDM, treatment plan, and plan for follow-up with patient. . patient agrees with plan.   No orders of the defined types were placed in this encounter.   *This clinic note was created using Dragon dictation software. Therefore, there may be occasional mistakes despite careful proofreading.   ?   Melynda Ripple, MD 07/15/19 1934

## 2019-07-15 NOTE — ED Triage Notes (Signed)
Patient states that a wrench slipped and hit his nose about 1 hour ago.  Patient has minimal bleeding at this time.  Patient reports some tenderness across the bridge of his nose.

## 2019-10-20 ENCOUNTER — Ambulatory Visit: Payer: 59 | Admitting: Internal Medicine

## 2019-10-21 ENCOUNTER — Telehealth: Payer: Self-pay

## 2019-10-21 NOTE — Telephone Encounter (Signed)
Patient wife Vaughan Basta called stating her and pt have tested positive for Covid this morning. Was tested a few days ago at [facility]. They are both very sick. Patient and wife are continuing to monitor their oxygen levels and tempeture's and taking tylenol every 6 hours. Pt has had a fever for 4 days in a row now. Explained if they cannot keep the fever down, or if they become weak or short of breathe- they need to call 911 and let them know on the phone they have Covid and they will take them to the ER.   She verbalized understanding of this.

## 2019-10-23 ENCOUNTER — Encounter: Payer: Self-pay | Admitting: Emergency Medicine

## 2019-10-23 ENCOUNTER — Emergency Department
Admission: EM | Admit: 2019-10-23 | Discharge: 2019-10-23 | Disposition: A | Discharge status: Home / Self Care | Payer: 59 | Attending: Emergency Medicine | Admitting: Emergency Medicine

## 2019-10-23 ENCOUNTER — Other Ambulatory Visit: Payer: Self-pay

## 2019-10-23 ENCOUNTER — Emergency Department: Payer: 59

## 2019-10-23 DIAGNOSIS — N183 Chronic kidney disease, stage 3 unspecified: Secondary | ICD-10-CM | POA: Diagnosis not present

## 2019-10-23 DIAGNOSIS — Z79899 Other long term (current) drug therapy: Secondary | ICD-10-CM | POA: Diagnosis not present

## 2019-10-23 DIAGNOSIS — U071 COVID-19: Secondary | ICD-10-CM | POA: Diagnosis not present

## 2019-10-23 DIAGNOSIS — Z87891 Personal history of nicotine dependence: Secondary | ICD-10-CM | POA: Insufficient documentation

## 2019-10-23 DIAGNOSIS — J1282 Pneumonia due to coronavirus disease 2019: Secondary | ICD-10-CM

## 2019-10-23 DIAGNOSIS — R509 Fever, unspecified: Secondary | ICD-10-CM | POA: Diagnosis present

## 2019-10-23 DIAGNOSIS — J1289 Other viral pneumonia: Secondary | ICD-10-CM | POA: Diagnosis not present

## 2019-10-23 LAB — CBC
HCT: 41.9 % (ref 39.0–52.0)
Hemoglobin: 14.6 g/dL (ref 13.0–17.0)
MCH: 30.7 pg (ref 26.0–34.0)
MCHC: 34.8 g/dL (ref 30.0–36.0)
MCV: 88.2 fL (ref 80.0–100.0)
Platelets: 183 10*3/uL (ref 150–400)
RBC: 4.75 MIL/uL (ref 4.22–5.81)
RDW: 12.2 % (ref 11.5–15.5)
WBC: 3.9 10*3/uL — ABNORMAL LOW (ref 4.0–10.5)
nRBC: 0 % (ref 0.0–0.2)

## 2019-10-23 LAB — BASIC METABOLIC PANEL
Anion gap: 9 (ref 5–15)
BUN: 16 mg/dL (ref 8–23)
CO2: 26 mmol/L (ref 22–32)
Calcium: 8.2 mg/dL — ABNORMAL LOW (ref 8.9–10.3)
Chloride: 100 mmol/L (ref 98–111)
Creatinine, Ser: 1.4 mg/dL — ABNORMAL HIGH (ref 0.61–1.24)
GFR calc Af Amer: >60 mL/min (ref >60)
GFR calc non Af Amer: 53 mL/min — ABNORMAL LOW (ref >60)
Glucose, Bld: 101 mg/dL — ABNORMAL HIGH (ref 70–99)
Potassium: 4.7 mmol/L (ref 3.5–5.1)
Sodium: 135 mmol/L (ref 135–145)

## 2019-10-23 MED ORDER — ACETAMINOPHEN 325 MG PO TABS
650.0000 mg | ORAL_TABLET | Freq: Once | ORAL | Status: AC
  Administered 2019-10-23: 13:00:00 650 mg via ORAL
  Filled 2019-10-23: qty 2

## 2019-10-23 MED ORDER — AZITHROMYCIN 500 MG PO TABS
500.0000 mg | ORAL_TABLET | Freq: Once | ORAL | Status: AC
  Administered 2019-10-23: 500 mg via ORAL
  Filled 2019-10-23: qty 1

## 2019-10-23 MED ORDER — AZITHROMYCIN 250 MG PO TABS: ORAL_TABLET | ORAL | 0 refills | Status: AC

## 2019-10-23 NOTE — Discharge Instructions (Addendum)
Please begin antibiotics tomorrow.  Continue taking Tylenol for fever.  Please call primary care tomorrow for a follow up appointment.  Return to the emergency department for worsening of symptoms.

## 2019-10-23 NOTE — ED Triage Notes (Signed)
Pt sates that his wife got COVID and got better and then he got tested last Tuesday and it was positive and he keeps running a fever. Unsure of why his wife got better and he still has a fever.

## 2019-10-23 NOTE — ED Provider Notes (Signed)
[facility] Emergency Department Provider Note  ____________________________________________  Time seen: Approximately 1:03 PM  I have reviewed the triage vital signs and the nursing notes.   HISTORY  Chief Complaint Fever    HPI Darryl Walker is a 63 y.o. male that presents to the emergency department for evaluation of fever and cough for 1 week and testing positive for Covid 5 days ago.  Fever has been as high as 103 point 6 at night.  Fever seems to break in the morning and is gone throughout the day.  He has had a couple episodes of diarrhea.  Patient and his wife tested positive for Covid 19 on Tuesday.  He does not understand why his wife never had a fever and why her symptoms are better and his are not. He does not smoke. He denies any nasal congestion, sore throat, shortness of breath, chest pain, vomiting, abdominal pain.   Past Medical History:  Diagnosis Date  . Allergy   . Barrett esophagus   . Chronic kidney disease (CKD), stage III (moderate) 07/2014  . Environmental and seasonal allergies   . GERD (gastroesophageal reflux disease)   . History of cardiac monitoring 12/2017   referred to Cardiology   . Hypercholesterolemia   . Inguinal hernia 09/28/2017  . Kidney disease   . Rotator cuff dysfunction   . 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 Pre-diabetes 08/18/2018  . 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 Urine test positive for microalbuminuria 03/27/2017  . Impingement syndrome of right shoulder 04/04/2016  . CKD (chronic kidney disease) stage 3, GFR 30-59 ml/min 07/21/2014  . Allergic rhinitis 07/20/2014  . Hypercholesterolemia 07/20/2014  . Barrett's esophagus without dysplasia 07/18/2014    Past Surgical History:  Procedure Laterality Date  . CIRCUMCISION    . COLONOSCOPY  06/13/2011  . EYE SURGERY    . left arm fracture    . UPPER GASTROINTESTINAL ENDOSCOPY  04/15/2017   Barretts -  repeat 2 yrs  . WISDOM TOOTH EXTRACTION      Prior to Admission medications   Medication Sig Start Date End Date Taking? Authorizing Provider  atorvastatin (LIPITOR) 10 MG tablet Take 1 tablet (10 mg total) by mouth daily at 6 PM. 04/26/19   Reubin MilanBerglund, Laura H, MD  azithromycin (ZITHROMAX Z-PAK) 250 MG tablet Take 2 tablets (500 mg) on  Day 1,  followed by 1 tablet (250 mg) once daily on Days 2 through 5. 10/24/19   Enid DerryWagner, Zsazsa Bahena, PA-C  famotidine (PEPCID) 20 MG tablet Take by mouth.    [provider]  fluticasone (FLONASE) 50 MCG/ACT nasal spray Place into the nose.    [provider]  loratadine (CLARITIN) 10 MG tablet Take by mouth.    [provider]  Multiple Vitamin (MULTIVITAMIN) tablet Take by mouth.    [provider]  OMEPRAZOLE PO Take 40 mg by mouth. Take 2 otc 20 mg daily 10/23/16   [provider]    Allergies Patient has no known allergies.  Family History  Problem Relation Age of Onset  . Diabetes Mother   . Glaucoma Father   . Prostate cancer Father   . Glaucoma Sister     Social History Social History   Tobacco Use  . Smoking status: Never Smoker  . Smokeless tobacco: Former NeurosurgeonUser    Types: Chew  Substance Use Topics  . Alcohol use: Yes    Alcohol/week: 2.0 standard drinks  Types: 2 Standard drinks or equivalent per week  . Drug use: Never     Review of Systems  Constitutional: Positive for fever. Eyes: No visual changes. No discharge. ENT: Negative for congestion and rhinorrhea. Cardiovascular: No chest pain. Respiratory: Positive for cough. No SOB. Gastrointestinal: No abdominal pain.  No nausea, no vomiting.  No diarrhea.  No constipation. Musculoskeletal: Negative for musculoskeletal pain. Skin: Negative for rash, abrasions, lacerations, ecchymosis. Neurological: Negative for headaches.   ____________________________________________   PHYSICAL EXAM:  VITAL SIGNS: ED Triage Vitals  Enc  Vitals Group     BP 10/23/19 1123 136/85     Pulse Rate 10/23/19 1123 91     Resp 10/23/19 1123 16     Temp 10/23/19 1123 99.5 F (37.5 C)     Temp Source 10/23/19 1123 Oral     SpO2 10/23/19 1123 96 %     Weight 10/23/19 1052 160 lb (72.6 kg)     Height 10/23/19 1052 5\' 10"  (1.778 m)     Head Circumference --      Peak Flow --      Pain Score 10/23/19 1052 0     Pain Loc --      Pain Edu? --      Excl. in [place]? --      Constitutional: Alert and oriented. Well appearing and in no acute distress. Eyes: Conjunctivae are normal. PERRL. EOMI. No discharge. Head: Atraumatic. ENT: No frontal and maxillary sinus tenderness.      Ears: Tympanic membranes pearly gray with good landmarks. No discharge.      Nose: No congestion/rhinnorhea.      Mouth/Throat: Mucous membranes are moist. Oropharynx non-erythematous. Tonsils not enlarged. No exudates. Uvula midline. Neck: No stridor.   Hematological/Lymphatic/Immunilogical: No cervical lymphadenopathy. Cardiovascular: Normal rate, regular rhythm.  Good peripheral circulation. Respiratory: Normal respiratory effort without tachypnea or retractions. Lungs CTAB. Good air entry to the bases with no decreased or absent breath sounds. Gastrointestinal: Bowel sounds 4 quadrants. Soft and nontender to palpation. No guarding or rigidity. No palpable masses. No distention. Musculoskeletal: Full range of motion to all extremities. No gross deformities appreciated. Neurologic:  Normal speech and language. No gross focal neurologic deficits are appreciated.  Skin:  Skin is warm, dry and intact. No rash noted. Psychiatric: Mood and affect are normal. Speech and behavior are normal. Patient exhibits appropriate insight and judgement.   ____________________________________________   LABS (all labs ordered are listed, but only abnormal results are displayed)  Labs Reviewed  CBC - Abnormal; Notable for the following components:      Result Value   WBC  3.9 (*)    All other components within normal limits  BASIC METABOLIC PANEL - Abnormal; Notable for the following components:   Glucose, Bld 101 (*)    Creatinine, Ser 1.40 (*)    Calcium 8.2 (*)    GFR calc non Af Amer 53 (*)    All other components within normal limits   ____________________________________________  EKG   ____________________________________________  RADIOLOGY Robinette Haines, personally viewed and evaluated these images (plain radiographs) as part of my medical decision making, as well as reviewing the written report by the radiologist.  DG Chest Portable 1 View  Result Date: 10/23/2019 CLINICAL DATA:  COVID-19.  Fever. EXAM: PORTABLE CHEST 1 VIEW COMPARISON:  None. FINDINGS: No pneumothorax. The heart, hila, mediastinum are normal. No nodules or masses. Mild opacity in the periphery of the left mid lung. No other abnormalities. IMPRESSION: Mild  opacity in the periphery of the left mid lung. No other abnormalities. Recommend short-term follow-up imaging to ensure resolution. Electronically Signed   By: Gerome Sam III M.D   On: 10/23/2019 12:49    ____________________________________________    PROCEDURES  Procedure(s) performed:    Procedures    Medications  azithromycin [facility]) tablet 500 mg (500 mg Oral Given 10/23/19 1319)  acetaminophen (TYLENOL) tablet 650 mg (650 mg Oral Given 10/23/19 1319)     ____________________________________________   INITIAL IMPRESSION / ASSESSMENT AND PLAN / ED COURSE  Pertinent labs & imaging results that were available during my care of the patient were reviewed by me and considered in my medical decision making (see chart for details).  Review of the [place] CSRS was performed in accordance of the [facility] prior to dispensing any controlled drugs.   Patient's diagnosis is consistent with Covid 19 pneumonia. Vital signs and exam are reassuring.  Chest x-ray shows mild opacity in the periphery of left mid  lung.  He denies any shortness of breath or chest pain.  Patient appears well and is staying well hydrated.  Patient has a history of CKD and creatinine and GFR are consistent with his previous lab work in 2019.  Patient states that he does follow-up with a specialist for this.  Patient feels comfortable going home. Patient will be discharged home with prescriptions for azithromycin. Patient is to follow up with primary care as needed or otherwise directed. Patient is given ED precautions to return to the ED for any worsening or new symptoms.  JYREN CERASOLI was evaluated in Emergency Department on 10/23/2019 for the symptoms described in the history of present illness. He was evaluated in the context of the global COVID-19 pandemic, which necessitated consideration that the patient might be at risk for infection with the SARS-CoV-2 virus that causes COVID-19. Institutional protocols and algorithms that pertain to the evaluation of patients at risk for COVID-19 are in a state of rapid change based on information released by regulatory bodies including the CDC and federal and state organizations. These policies and algorithms were followed during the patient's care in the ED.   ____________________________________________  FINAL CLINICAL IMPRESSION(S) / ED DIAGNOSES  Final diagnoses:  COVID-19  Pneumonia due to COVID-19 virus      NEW MEDICATIONS STARTED DURING THIS VISIT:  ED Discharge Orders         Ordered    azithromycin (ZITHROMAX Z-PAK) 250 MG tablet     10/23/19 1447              This chart was dictated using voice recognition software/Dragon. Despite best efforts to proofread, errors can occur which can change the meaning. Any change was purely unintentional.    Enid Derry, PA-C 10/23/19 1518    Concha Se, MD 10/25/19 2308

## 2019-10-24 ENCOUNTER — Telehealth: Payer: Self-pay | Admitting: Internal Medicine

## 2019-10-24 NOTE — Telephone Encounter (Signed)
Spoke to pts wife & advised her if his fever spikes again or the O2 drops then to take her husband to the ER if she feels he needs to be seen.  Advised that we can't see him in clinic due to Covid.

## 2019-10-24 NOTE — Telephone Encounter (Signed)
Wife called pt temp was at 103 and oxygen at 82, wants him to be seen by Dr.B, I mentioned we cannot see him in office and provider is out so we cant do a phone visit. Pt was seen over the weekend at the ER and advised to go back to ER

## 2019-10-26 NOTE — Telephone Encounter (Signed)
Vaughan Basta called regarding her husband.  She is concerned that he is still running a fever to 101, esp at night.  He is sleeping most of the day.  His home O2 is 89-90%.  He is not eating much.  He is drinking some but probably not 80-100 oz needed. He has mild diarrhea but none today. He was seen in ED on 12/20 for Covid-19 and pneumonia.  He was prescribed Zpak. I advised to continue Tylenol 1000 mg every 6 hours.  He needs to increase his fluids.  He will finish the Zpak.  I advised to expect a month to fully recover from the virus and the pneumonia. Precautions for going back to the ED are given. Patient and wife expressed understanding and will call back if needed.

## 2019-11-09 ENCOUNTER — Telehealth: Payer: Self-pay

## 2019-11-09 MED ORDER — METOCLOPRAMIDE HCL 10 MG PO TABS
10.00 | ORAL_TABLET | ORAL | Status: DC
Start: 2019-11-10 — End: 2019-11-09

## 2019-11-09 MED ORDER — CISATRACURIUM BESYLATE (PF) 10 MG/5ML IV SOLN
10.00 | INTRAVENOUS | Status: DC
Start: ? — End: 2019-11-09

## 2019-11-09 MED ORDER — CHLORHEXIDINE GLUCONATE 0.12 % MT SOLN
5.00 | OROMUCOSAL | Status: DC
Start: 2019-11-12 — End: 2019-11-09

## 2019-11-09 MED ORDER — CALCIUM CARBONATE ANTACID 500 MG PO CHEW
CHEWABLE_TABLET | ORAL | Status: DC
Start: ? — End: 2019-11-09

## 2019-11-09 MED ORDER — CEFEPIME HCL 2 G IJ SOLR
2.00 | INTRAMUSCULAR | Status: DC
Start: 2019-11-10 — End: 2019-11-09

## 2019-11-09 MED ORDER — DEXTROSE 50 % IV SOLN
12.50 | INTRAVENOUS | Status: DC
Start: ? — End: 2019-11-09

## 2019-11-09 MED ORDER — ATORVASTATIN CALCIUM 10 MG PO TABS
10.00 | ORAL_TABLET | ORAL | Status: DC
Start: 2019-11-13 — End: 2019-11-09

## 2019-11-09 MED ORDER — ENOXAPARIN SODIUM 40 MG/0.4ML ~~LOC~~ SOLN
40.00 | SUBCUTANEOUS | Status: DC
Start: 2019-11-09 — End: 2019-11-09

## 2019-11-09 MED ORDER — GENERIC EXTERNAL MEDICATION
Status: DC
Start: ? — End: 2019-11-09

## 2019-11-09 MED ORDER — FLUTICASONE PROPIONATE 50 MCG/ACT NA SUSP
2.00 | NASAL | Status: DC
Start: 2019-11-10 — End: 2019-11-09

## 2019-11-09 MED ORDER — GUAIFENESIN 100 MG/5ML PO SYRP
200.00 | ORAL_SOLUTION | ORAL | Status: DC
Start: ? — End: 2019-11-09

## 2019-11-09 MED ORDER — INSULIN REGULAR HUMAN 100 UNIT/ML IJ SOLN
0.00 | INTRAMUSCULAR | Status: DC
Start: 2019-11-10 — End: 2019-11-09

## 2019-11-09 MED ORDER — ACETAMINOPHEN 500 MG PO TABS
1000.00 | ORAL_TABLET | ORAL | Status: DC
Start: ? — End: 2019-11-09

## 2019-11-09 MED ORDER — LORAZEPAM 2 MG PO TABS
4.00 | ORAL_TABLET | ORAL | Status: DC
Start: 2019-11-10 — End: 2019-11-09

## 2019-11-09 MED ORDER — GENERIC EXTERNAL MEDICATION
0.00 | Status: DC
Start: ? — End: 2019-11-09

## 2019-11-09 MED ORDER — FAMOTIDINE 20 MG PO TABS
20.00 | ORAL_TABLET | ORAL | Status: DC
Start: 2019-11-13 — End: 2019-11-09

## 2019-11-09 MED ORDER — SALINE NASAL SPRAY 0.65 % NA SOLN
1.00 | NASAL | Status: DC
Start: ? — End: 2019-11-09

## 2019-11-09 MED ORDER — SENNOSIDES 8.6 MG PO TABS
2.00 | ORAL_TABLET | ORAL | Status: DC
Start: 2019-11-10 — End: 2019-11-09

## 2019-11-09 MED ORDER — HYDROMORPHONE HCL 1 MG/ML IJ SOLN
4.00 | INTRAMUSCULAR | Status: DC
Start: ? — End: 2019-11-09

## 2019-11-09 MED ORDER — NOREPINEPHRINE-SODIUM CHLORIDE 8-0.9 MG/250ML-% IV SOLN
0.00 | INTRAVENOUS | Status: DC
Start: ? — End: 2019-11-09

## 2019-11-09 MED ORDER — OXYCODONE HCL 5 MG PO TABS
15.00 | ORAL_TABLET | ORAL | Status: DC
Start: 2019-11-10 — End: 2019-11-09

## 2019-11-09 MED ORDER — MIDAZOLAM HCL 10 MG/10ML IJ SOLN
0.00 | INTRAMUSCULAR | Status: DC
Start: ? — End: 2019-11-09

## 2019-11-09 MED ORDER — QUETIAPINE FUMARATE 25 MG PO TABS
25.00 | ORAL_TABLET | ORAL | Status: DC
Start: 2019-11-12 — End: 2019-11-09

## 2019-11-09 MED ORDER — WH PETROL-MINERAL OIL-LANOLIN 0.1-0.1 % OP OINT
1.00 | TOPICAL_OINTMENT | OPHTHALMIC | Status: DC
Start: 2019-11-12 — End: 2019-11-09

## 2019-11-09 MED ORDER — POLYETHYLENE GLYCOL 3350 17 GM/SCOOP PO POWD
17.00 | ORAL | Status: DC
Start: 2019-11-10 — End: 2019-11-09

## 2019-11-09 MED ORDER — CARBOXYMETHYLCELLULOSE SODIUM 1 % OP GEL
1.00 | OPHTHALMIC | Status: DC
Start: 2019-11-12 — End: 2019-11-09

## 2019-11-09 MED ORDER — HYDROMORPHONE HCL 1 MG/ML IJ SOLN
0.00 | INTRAMUSCULAR | Status: DC
Start: ? — End: 2019-11-09

## 2019-11-09 MED ORDER — NALOXONE HCL 4 MG/10ML IJ SOLN
0.10 | INTRAMUSCULAR | Status: DC
Start: ? — End: 2019-11-09

## 2019-11-09 MED ORDER — MIDAZOLAM HCL 10 MG/10ML IJ SOLN
5.00 | INTRAMUSCULAR | Status: DC
Start: ? — End: 2019-11-09

## 2019-11-09 MED ORDER — MAGNESIUM HYDROXIDE 400 MG/5ML PO SUSP
30.00 | ORAL | Status: DC
Start: ? — End: 2019-11-09

## 2019-11-09 NOTE — Telephone Encounter (Signed)
Patient wife Bonita Quin called saying patient has been admitted to [facility] hospital on a respirator. He was alert and fine on the way to the hospital. She called the Covid hotline and they told her he needed to go to the ER and that is the reason he went. She did not say why she called the covid line, but did mention he was alert, talking, and had no shortness of breathe symptoms.  FYI

## 2019-11-10 MED ORDER — HEPARIN SOD (PORCINE) IN D5W 100 UNIT/ML IV SOLN
12.00 | INTRAVENOUS | Status: DC
Start: ? — End: 2019-11-10

## 2019-11-10 MED ORDER — HEPARIN SODIUM (PORCINE) 1000 UNIT/ML IJ SOLN
2000.00 | INTRAMUSCULAR | Status: DC
Start: ? — End: 2019-11-10

## 2019-11-10 MED ORDER — GENERIC EXTERNAL MEDICATION
Status: DC
Start: ? — End: 2019-11-10

## 2019-11-11 MED ORDER — LORAZEPAM 2 MG PO TABS
6.00 | ORAL_TABLET | ORAL | Status: DC
Start: 2019-11-12 — End: 2019-11-11

## 2019-11-11 MED ORDER — GENERIC EXTERNAL MEDICATION
30.00 | Status: DC
Start: 2019-11-12 — End: 2019-11-11

## 2019-11-11 MED ORDER — GENERIC EXTERNAL MEDICATION
5000.00 | Status: DC
Start: ? — End: 2019-11-11

## 2019-11-11 MED ORDER — GENERIC EXTERNAL MEDICATION
0.00 | Status: DC
Start: ? — End: 2019-11-11

## 2019-11-11 MED ORDER — CEFEPIME HCL 2 G IJ SOLR
2.00 | INTRAMUSCULAR | Status: DC
Start: 2019-11-12 — End: 2019-11-11

## 2019-11-11 MED ORDER — METOCLOPRAMIDE HCL 5 MG/ML IJ SOLN
5.00 | INTRAMUSCULAR | Status: DC
Start: 2019-11-12 — End: 2019-11-11

## 2019-11-11 MED ORDER — HEPARIN SODIUM (PORCINE) 1000 UNIT/ML IJ SOLN
1.70 | INTRAMUSCULAR | Status: DC
Start: ? — End: 2019-11-11

## 2019-11-11 MED ORDER — HEPARIN SODIUM (PORCINE) 1000 UNIT/ML IJ SOLN
1.50 | INTRAMUSCULAR | Status: DC
Start: ? — End: 2019-11-11

## 2019-11-11 MED ORDER — GENERIC EXTERNAL MEDICATION
800.00 | Status: DC
Start: 2019-11-12 — End: 2019-11-11

## 2019-11-11 MED ORDER — GENERIC EXTERNAL MEDICATION
Status: DC
Start: ? — End: 2019-11-11

## 2019-11-11 MED ORDER — HEPARIN SODIUM (PORCINE) 1000 UNIT/ML IJ SOLN
1.60 | INTRAMUSCULAR | Status: DC
Start: ? — End: 2019-11-11

## 2019-11-13 MED ORDER — HYDROMORPHONE HCL 1 MG/ML IJ SOLN
5.00 | INTRAMUSCULAR | Status: DC
Start: ? — End: 2019-11-13

## 2019-11-13 MED ORDER — GENERIC EXTERNAL MEDICATION
Status: DC
Start: ? — End: 2019-11-13

## 2019-11-13 MED ORDER — GENERIC EXTERNAL MEDICATION
0.03 | Status: DC
Start: ? — End: 2019-11-13

## 2019-11-13 MED ORDER — GENERIC EXTERNAL MEDICATION
0.00 | Status: DC
Start: ? — End: 2019-11-13

## 2019-12-05 DEATH — deceased

## 2020-04-26 ENCOUNTER — Encounter: Payer: 59 | Admitting: Internal Medicine
